# Patient Record
Sex: Male | Born: 1995 | Race: Black or African American | Hispanic: No | Marital: Single | Smoking: Current every day smoker
Health system: Southern US, Community
[De-identification: ages and names within clinical notes are randomized; demographics above are authoritative.]

---
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
## 2017-11-09 LAB — HIV ANTIBODY (ROUTINE TESTING W REFLEX): HIV: NONREACTIVE

## 2018-01-21 ENCOUNTER — Other Ambulatory Visit: Payer: Self-pay

## 2018-01-21 ENCOUNTER — Emergency Department
Admission: EM | Admit: 2018-01-21 | Discharge: 2018-01-21 | Disposition: A | Discharge status: Home / Self Care | Payer: Self-pay | Attending: Emergency Medicine | Admitting: Emergency Medicine

## 2018-01-21 ENCOUNTER — Encounter: Payer: Self-pay | Admitting: Emergency Medicine

## 2018-01-21 DIAGNOSIS — X509XXA Other and unspecified overexertion or strenuous movements or postures, initial encounter: Secondary | ICD-10-CM | POA: Insufficient documentation

## 2018-01-21 DIAGNOSIS — S76212A Strain of adductor muscle, fascia and tendon of left thigh, initial encounter: Secondary | ICD-10-CM | POA: Insufficient documentation

## 2018-01-21 DIAGNOSIS — Y998 Other external cause status: Secondary | ICD-10-CM | POA: Insufficient documentation

## 2018-01-21 DIAGNOSIS — Y929 Unspecified place or not applicable: Secondary | ICD-10-CM | POA: Insufficient documentation

## 2018-01-21 DIAGNOSIS — F17210 Nicotine dependence, cigarettes, uncomplicated: Secondary | ICD-10-CM | POA: Insufficient documentation

## 2018-01-21 DIAGNOSIS — Y93E6 Activity, residential relocation: Secondary | ICD-10-CM | POA: Insufficient documentation

## 2018-01-21 MED ORDER — ONDANSETRON 8 MG PO TBDP: 8.0000 mg | ORAL_TABLET | Freq: Once | ORAL | Status: DC

## 2018-01-21 MED ORDER — TRAMADOL HCL 50 MG PO TABS: 50.0000 mg | ORAL_TABLET | Freq: Four times a day (QID) | ORAL | 0 refills | Status: AC | PRN

## 2018-01-21 MED ORDER — NAPROXEN 500 MG PO TABS: 500.0000 mg | ORAL_TABLET | Freq: Two times a day (BID) | ORAL | Status: DC

## 2018-01-21 MED ORDER — SULFAMETHOXAZOLE-TRIMETHOPRIM 800-160 MG PO TABS
1.0000 | ORAL_TABLET | Freq: Once | ORAL | Status: AC
  Administered 2018-01-21: 1 via ORAL
  Filled 2018-01-21: qty 1

## 2018-01-21 MED ORDER — CEPHALEXIN 500 MG PO CAPS
500.0000 mg | ORAL_CAPSULE | Freq: Once | ORAL | Status: AC
  Administered 2018-01-21: 500 mg via ORAL
  Filled 2018-01-21: qty 1

## 2018-01-21 NOTE — ED Provider Notes (Signed)
[facility] Emergency Department Provider Note   ____________________________________________   First MD Initiated Contact with Patient 01/21/18 1131     (approximate)  I have reviewed the triage vital signs and the nursing notes.   HISTORY  Chief Complaint Groin Pain    HPI Brian Park is a 22 y.o. male patient presents with 1 week of left groin pain.  Patient stated pain is in the left inguinal area.  Patient state pain started after moving heavy furniture a week ago.  Patient also stated notes a bump to her left groin that comes and go.  Patient denies urinary complaints.  Patient denies urethral discharge.  Patient denies inguinal or testicular mass today.  Patient states that he noticed a small bump to the left groin which comes and go but is not present today.  Patient rates his pain as a 9/10.  Patient described the pain is "aching".  No increased pain with bowel movements.  No past medical history on file.  There are no active problems to display for this patient.     Prior to Admission medications   Medication Sig Start Date End Date Taking? Authorizing Provider  naproxen (NAPROSYN) 500 MG tablet Take 1 tablet (500 mg total) by mouth 2 (two) times daily with a meal. 01/21/18   Joni ReiningSmith,  K, PA-C  traMADol (ULTRAM) 50 MG tablet Take 1 tablet (50 mg total) by mouth every 6 (six) hours as needed. 01/21/18 01/21/19  Joni ReiningSmith,  K, PA-C    Allergies Patient has no known allergies.  No family history on file.  Social History Social History   Tobacco Use  . Smoking status: Current Every Day Smoker    Types: Cigarettes  . Smokeless tobacco: Never Used  Substance Use Topics  . Alcohol use: Never    Frequency: Never  . Drug use: Never    Review of Systems Constitutional: No fever/chills Eyes: No visual changes. ENT: No sore throat. Cardiovascular: Denies chest pain. Respiratory: Denies shortness of breath. Gastrointestinal: No  abdominal pain.  No nausea, no vomiting.  No diarrhea.  No constipation. Genitourinary: Negative for dysuria. Musculoskeletal: Negative for back pain. Skin: Negative for rash. Neurological: Negative for headaches, focal weakness or numbness.   ____________________________________________   PHYSICAL EXAM:  VITAL SIGNS: ED Triage Vitals  Enc Vitals Group     BP 01/21/18 1112 (!) 146/88     Pulse Rate 01/21/18 1112 77     Resp 01/21/18 1112 20     Temp 01/21/18 1112 98 F (36.7 C)     Temp Source 01/21/18 1112 Oral     SpO2 01/21/18 1112 100 %     Weight 01/21/18 1113 150 lb (68 kg)     Height 01/21/18 1113 5\' 6"  (1.676 m)     Head Circumference --      Peak Flow --      Pain Score 01/21/18 1118 9     Pain Loc --      Pain Edu? --      Excl. in GC? --    Constitutional: Alert and oriented. Well appearing and in no acute distress. Cardiovascular: Normal rate, regular rhythm. Grossly normal heart sounds.  Good peripheral circulation. Respiratory: Normal respiratory effort.  No retractions. Lungs CTAB. Gastrointestinal: Soft and nontender. No distention. No abdominal bruits. No CVA tenderness. Genitourinary: No obvious or palpable left inguinal mass.  Patient is moderate guarding with palpation in the left inguinal area.  No scrotal mass palpable visible  at this time.  Invagination of the scrotum was without discomfort. Skin:  Skin is warm, dry and intact. No rash noted. Psychiatric: Mood and affect are normal. Speech and behavior are normal.  ____________________________________________   LABS (all labs ordered are listed, but only abnormal results are displayed)  Labs Reviewed - No data to display ____________________________________________  EKG   ____________________________________________  RADIOLOGY  ED MD interpretation:    Official radiology report(s): No results found.  ____________________________________________   PROCEDURES  Procedure(s)  performed: None  Procedures  Critical Care performed: No  ____________________________________________   INITIAL IMPRESSION / ASSESSMENT AND PLAN / ED COURSE  As part of my medical decision making, I reviewed the following data within the electronic MEDICAL RECORD NUMBER    Patient left ankle complaints consistent with ankle strain.  Patient given discharge care instruction advised take medication as directed.  Patient advised to follow urology if consistent recurrence of complaint.      ____________________________________________   FINAL CLINICAL IMPRESSION(S) / ED DIAGNOSES  Final diagnoses:  Inguinal strain, left, initial encounter     ED Discharge Orders         Ordered    naproxen (NAPROSYN) 500 MG tablet  2 times daily with meals     01/21/18 1153    traMADol (ULTRAM) 50 MG tablet  Every 6 hours PRN     01/21/18 1153           Note:  This document was prepared using Dragon voice recognition software and may include unintentional dictation errors.    Joni ReiningSmith,  K, PA-C 01/21/18 1311    Jene EveryKinner, Robert, MD 01/21/18 1425

## 2018-01-21 NOTE — ED Notes (Signed)
See triage note    States he felt some pain with swelling to left groin area   Noticed pain with lifting furniture

## 2018-01-21 NOTE — ED Triage Notes (Signed)
Pt to ED from home c/o left groin pain, states moving heavy furniture yesterday.  States noticed a bump to left groin that will come and go.

## 2018-01-21 NOTE — Discharge Instructions (Signed)
Follow discharge care instruction take medication as directed.  No heavy lifting for 5 to 7 days.  Follow-up with urology if no improvement in 1 week.

## 2018-10-08 ENCOUNTER — Other Ambulatory Visit: Payer: Self-pay

## 2018-10-08 ENCOUNTER — Encounter: Payer: Self-pay | Admitting: Family Medicine

## 2018-10-08 ENCOUNTER — Ambulatory Visit: Payer: Self-pay | Admitting: Physician Assistant

## 2018-10-08 DIAGNOSIS — A5401 Gonococcal cystitis and urethritis, unspecified: Secondary | ICD-10-CM

## 2018-10-08 DIAGNOSIS — Z113 Encounter for screening for infections with a predominantly sexual mode of transmission: Secondary | ICD-10-CM

## 2018-10-08 LAB — GRAM STAIN

## 2018-10-08 MED ORDER — CEFTRIAXONE SODIUM 250 MG IJ SOLR
250.0000 mg | Freq: Once | INTRAMUSCULAR | Status: AC
  Administered 2018-10-08: 16:00:00 250 mg via INTRAMUSCULAR

## 2018-10-08 MED ORDER — AZITHROMYCIN 500 MG PO TABS
1000.0000 mg | ORAL_TABLET | Freq: Once | ORAL | Status: AC
  Administered 2018-10-08: 16:00:00 1000 mg via ORAL

## 2018-10-08 NOTE — Progress Notes (Signed)
Patient here for STD testing.Tomeko Scoville Brewer-Jensen, RN 

## 2018-10-08 NOTE — Progress Notes (Signed)
Gram stain reviewed, patient treated for Gonorrhea per SO. Report card filled out and partner card given.Jenetta Downer, RN

## 2018-10-08 NOTE — Progress Notes (Signed)
    STI clinic/screening visit  Subjective:  Brian Park is a 23 y.o. male being seen today for an STI screening visit. The patient reports they do have symptoms.  Patient has the following medical conditions:  There are no active problems to display for this patient.    Chief Complaint  Patient presents with  . SEXUALLY TRANSMITTED DISEASE    HPI  Patient reports that he has had some clearish penile discharge for about 1 week.  Denies other symptoms.  Requests screening.   See flowsheet for further details and programmatic requirements.    The following portions of the patient's history were reviewed and updated as appropriate: allergies, current medications, past medical history, past social history, past surgical history and problem list.  Objective:  There were no vitals filed for this visit.  Physical Exam Constitutional:      Appearance: Normal appearance.  HENT:     Head: Normocephalic and atraumatic.     Mouth/Throat:     Mouth: Mucous membranes are moist.     Pharynx: Oropharynx is clear. No oropharyngeal exudate or posterior oropharyngeal erythema.  Eyes:     Conjunctiva/sclera: Conjunctivae normal.  Neck:     Musculoskeletal: Neck supple.  Pulmonary:     Effort: Pulmonary effort is normal.  Abdominal:     Palpations: Abdomen is soft. There is no mass.     Tenderness: There is no abdominal tenderness. There is no guarding or rebound.  Genitourinary:    Penis: Normal.      Scrotum/Testes: Normal.     Comments: Pubic area without nits, lice, edema, erythema, lesions, and inguinal adenopathy. Penis circumcised and with yellow discharge at meatus. Lymphadenopathy:     Cervical: No cervical adenopathy.  Skin:    General: Skin is warm and dry.     Findings: No bruising, erythema, lesion or rash.  Neurological:     Mental Status: He is alert and oriented to person, place, and time.  Psychiatric:        Mood and Affect: Mood normal.        Behavior: Behavior  normal.        Thought Content: Thought content normal.        Judgment: Judgment normal.       Assessment and Plan:  [name] is a 23 y.o. male presenting to the [facility] Department for STI screening  1. Screening examination for venereal disease Patient with symptoms today. Rec condoms with all sex Await test results.  Counseled that RN will call if needs to RTC for further treatment once results are back.  - Gram stain - Gonococcus culture - HIV [place] [facility] - Syphilis Serology, [facility]  2. Gonococcal urethritis in male Will treat for GC today with Azithromycin 1g po DOT and Ceftriaxone 250mg  IM today. No sex for 7days and until after partner completes treatment. RTC if vomits < 2 hr after taking medicine for re-treatment.  - azithromycin (ZITHROMAX) tablet 1,000 mg - cefTRIAXone (ROCEPHIN) injection 250 mg     Return in about 3 months (around 01/07/2019) for TOC.  No future appointments.  Jerene Dilling, PA
